# Patient Record
Sex: Male | Born: 1995 | Race: Black or African American | Hispanic: No | Marital: Single | Smoking: Never smoker
Health system: Southern US, Community
[De-identification: ages and names within clinical notes are randomized; demographics above are authoritative.]

---
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
## 2016-08-22 DEATH — deceased
